# Patient Record
Sex: Female | Born: 1985 | Race: Black or African American | Hispanic: No | Marital: Single | Smoking: Current some day smoker
Health system: Southern US, Community
[De-identification: ages and names within clinical notes are randomized; demographics above are authoritative.]

## PROBLEM LIST (undated history)

## (undated) DIAGNOSIS — L0390 Cellulitis, unspecified: Secondary | ICD-10-CM

## (undated) DIAGNOSIS — A4902 Methicillin resistant Staphylococcus aureus infection, unspecified site: Secondary | ICD-10-CM

## (undated) HISTORY — PX: OVARIAN CYST REMOVAL: SHX89

## (undated) HISTORY — PX: BREAST SURGERY: SHX581

---

## 2008-05-17 ENCOUNTER — Encounter (INDEPENDENT_AMBULATORY_CARE_PROVIDER_SITE_OTHER): Payer: Self-pay | Admitting: Obstetrics and Gynecology

## 2008-05-17 ENCOUNTER — Ambulatory Visit (HOSPITAL_COMMUNITY): Admission: AD | Admit: 2008-05-17 | Discharge: 2008-05-17 | Payer: Self-pay | Admitting: Obstetrics and Gynecology

## 2010-07-06 LAB — COMPREHENSIVE METABOLIC PANEL
ALT: 28 U/L (ref 0–35)
Alkaline Phosphatase: 67 U/L (ref 39–117)
CO2: 23 mEq/L (ref 19–32)
Glucose, Bld: 100 mg/dL — ABNORMAL HIGH (ref 70–99)
Potassium: 3.1 mEq/L — ABNORMAL LOW (ref 3.5–5.1)
Sodium: 134 mEq/L — ABNORMAL LOW (ref 135–145)
Total Protein: 6.9 g/dL (ref 6.0–8.3)

## 2010-07-06 LAB — CBC
Hemoglobin: 12.1 g/dL (ref 12.0–15.0)
RBC: 4.51 MIL/uL (ref 3.87–5.11)
RDW: 13.9 % (ref 11.5–15.5)
WBC: 9 10*3/uL (ref 4.0–10.5)

## 2010-07-06 LAB — TYPE AND SCREEN: Antibody Screen: NEGATIVE

## 2010-07-06 LAB — PROTIME-INR: Prothrombin Time: 13.1 seconds (ref 11.6–15.2)

## 2010-08-03 NOTE — Op Note (Signed)
[name], [name] NO.:  [id number]   MEDICAL RECORD NO.:  [id number]          PATIENT TYPE:  AMB   LOCATION:  SDC                           FACILITY:  [facility]   PHYSICIAN:  Miguel Aschoff, M.D.       DATE OF BIRTH:  04/05/1985   DATE OF PROCEDURE:  05/17/2008  DATE OF DISCHARGE:                               OPERATIVE REPORT   PREOPERATIVE DIAGNOSIS:  Acute abdominal pain following suction  dilatation and evacuation, rule out perforation.   POSTOPERATIVE DIAGNOSIS:  Hematometra, no evidence of intra-abdominal  injury or uterine perforation.   SURGEON:  Miguel Aschoff, MD   ANESTHESIA:  General.   COMPLICATIONS:  None.   PROCEDURE:  Diagnostic laparoscopy followed by suction and curettage.   ANESTHESIA:  General.   JUSTIFICATION:  The patient is a 25 year old black female gravida 1,  para 0, who underwent elective termination of pregnancy at the [facility] on the morning of May 17, 2008.  Procedure was  carried out uneventfully.  However, in the recovery room, the patient  developed severe acute lower abdominal midline pain which seemed  weighing out of the norm for the expected postop course for suction D  and C.  Because of the nature of her severe lower abdominal pain, there  was concern that a perforation may have occurred.  The patient was  transferred to [facility] to undergo laparoscopy and laparotomy if  indicated.  The risks and benefits of surgical procedure were discussed  with the patient and her mother and informed consent was obtained for  laparoscopy, possible laparotomy, and suction D and C.   PROCEDURE:  The patient was taken to the operating and was placed in  supine position.  General anesthesia was administered without  difficulty.  She was then placed in dorsal lithotomy position, prepped  and draped in the usual sterile fashion.  The bladder was catheterized.  Following this, a Hulka tenaculum was placed through  the cervix.  After  examination revealed cervix to be anterior, globular, approximately  these equivalent size.  No definite adnexal masses were noted.  Following this, attention was directed to the abdomen where a small  infraumbilical incision was made.  A Veress needle was inserted and then  the abdomen was insufflated with 3 L of CO2.  Following insufflation,  the trocar to laparoscope was placed, followed by laparoscope itself.  Then, a 5-mm suprapubic port was established under direct visualization.  Systematic inspection revealed the uterus again to be globular anterior.  There was no evidence of any uterine perforation or hematoma present in  the pelvis, broad ligament or any abdominal structure.  The uterus  appeared to be intact.  Tubes and ovaries were inspected.  The tubes  were normal along their course.  The fimbria was fine and delicate.  The  left ovary was noted to be totally unremarkable.  The cul-de-sac was  unremarkable and then remarkable finding was a corpus luteum cyst  involving the right ovary which was approximately 4-5 cm in size.  The  remainder of the abdominal contents appeared within  normal limits.  The  intestinal surfaces were unremarkable.  The liver was inspected,  appeared to be within normal limits as was the gallbladder, but no  evidence of any perforation, hematoma, or any other traumatic injury  within the abdomen and pelvis.  It was elected to proceed with a repeat  curettage, it was felt the patient probably had a hematometra causing  her symptoms, and at this point, the suction curettage was carried out  without difficulty and did yield a large amount of clots from within the  confines of the uterus.  Following this, suction procedure, attention  was redirected to the abdomen to ensure that this also did not cause any  intra-abdominal complications, and again the uterus appeared to be  intact and within normal limits.  At this point, the CO2 was  allowed to  escape.  The small incisions were closed using subcuticular 4-0 Vicryl.  The port sites were injected with 1% Xylocaine.  The patient was  reversed from the anesthetic and taken to recovery room in satisfactory  condition.   Plan is for the patient to be discharged home if feeling well enough.   MEDICATIONS FOR HOME:  1. Doxycycline 100 mg b.i.d. x3 days.  2. Darvocet-N 100 one every 4 hours as needed for pain.   DISCHARGE INSTRUCTIONS:  She was instructed to place nothing in the  vagina and call for any problems such as fever, pain, or heavy bleeding.  She also instructed to get birth control pills on May 22, 2008, and set  up a followup visit in 4 weeks for followup examination.      Miguel Aschoff, M.D.  Electronically Signed     AR/MEDQ  D:  05/17/2008  T:  05/17/2008  Job:  [id number]

## 2011-03-15 ENCOUNTER — Encounter: Payer: Self-pay | Admitting: *Deleted

## 2011-03-15 ENCOUNTER — Emergency Department (HOSPITAL_BASED_OUTPATIENT_CLINIC_OR_DEPARTMENT_OTHER)
Admission: EM | Admit: 2011-03-15 | Discharge: 2011-03-15 | Disposition: A | Discharge status: Home / Self Care | Payer: Commercial Managed Care - PPO | Attending: Emergency Medicine | Admitting: Emergency Medicine

## 2011-03-15 DIAGNOSIS — H6690 Otitis media, unspecified, unspecified ear: Secondary | ICD-10-CM

## 2011-03-15 DIAGNOSIS — H9209 Otalgia, unspecified ear: Secondary | ICD-10-CM | POA: Insufficient documentation

## 2011-03-15 DIAGNOSIS — F17200 Nicotine dependence, unspecified, uncomplicated: Secondary | ICD-10-CM | POA: Insufficient documentation

## 2011-03-15 HISTORY — DX: Cellulitis, unspecified: L03.90

## 2011-03-15 HISTORY — DX: Methicillin resistant Staphylococcus aureus infection, unspecified site: A49.02

## 2011-03-15 MED ORDER — AMOXICILLIN-POT CLAVULANATE 875-125 MG PO TABS
1.0000 | ORAL_TABLET | Freq: Once | ORAL | Status: AC
  Administered 2011-03-15: 1 via ORAL
  Filled 2011-03-15: qty 1

## 2011-03-15 MED ORDER — OXYMETAZOLINE HCL 0.05 % NA SOLN
2.0000 | Freq: Once | NASAL | Status: AC
  Administered 2011-03-15: 2 via NASAL
  Filled 2011-03-15: qty 15

## 2011-03-15 MED ORDER — AMOXICILLIN-POT CLAVULANATE 875-125 MG PO TABS: 1.0000 | ORAL_TABLET | Freq: Two times a day (BID) | ORAL | Status: AC

## 2011-03-15 NOTE — ED Notes (Signed)
Right ear pain x 3 days

## 2011-03-16 NOTE — ED Provider Notes (Signed)
History     CSN: [id number]  Arrival date & time 03/15/11  1634   First MD Initiated Contact with Patient 03/15/11 1702      Chief Complaint  Patient presents with  . Otalgia    (Consider location/radiation/quality/duration/timing/severity/associated sxs/prior treatment) HPI Patient is a 25 yo F with 7/10 right ear pain worse with laying flat.  She has no nausea, vomiting, or fevers.  She has been diagnosed with intermittent ear infections recently that have been treated with amoxicillin.  Patient's pain has been relieved at home with alleve.  There are no other associated or modifying factors.   Past Medical History  Diagnosis Date  . MRSA (methicillin resistant Staphylococcus aureus)   . Cellulitis     Past Surgical History  Procedure Date  . Ovarian cyst removal     No family history on file.  History  Substance Use Topics  . Smoking status: Current Some Day Smoker    Types: Cigarettes  . Smokeless tobacco: Never Used  . Alcohol Use: No    OB History    Grav Para Term Preterm Abortions TAB SAB Ect Mult Living                  Review of Systems  Constitutional: Negative.   HENT: Positive for ear pain and ear discharge.   Eyes: Negative.   Respiratory: Negative.   Cardiovascular: Negative.   Gastrointestinal: Negative.   Genitourinary: Negative.   Musculoskeletal: Negative.   Skin: Negative.   Neurological: Negative.   Hematological: Negative.   Psychiatric/Behavioral: Negative.   All other systems reviewed and are negative.    Allergies  Review of patient's allergies indicates no known allergies.  Home Medications   Current Outpatient Rx  Name Route Sig Dispense Refill  . AMOXICILLIN-POT CLAVULANATE 875-125 MG PO TABS Oral Take 1 tablet by mouth 2 (two) times daily. 20 tablet 0    BP 137/63  Pulse 67  Temp(Src) 98.9 F (37.2 C) (Oral)  Resp 20  Ht 5\' 7"  (1.702 m)  Wt 273 lb (123.832 kg)  BMI 42.76 kg/m2  SpO2 100%  LMP  12/20/2010  Physical Exam  Nursing note and vitals reviewed. Constitutional: She is oriented to person, place, and time. She appears well-developed and well-nourished. No distress.  HENT:  Head: Normocephalic and atraumatic.  Right Ear: A middle ear effusion is present.  Left Ear: Tympanic membrane normal.  Nose: Nose normal.  Mouth/Throat: Uvula is midline and oropharynx is clear and moist.  Eyes: Conjunctivae and EOM are normal. Pupils are equal, round, and reactive to light.  Neck: Normal range of motion.  Cardiovascular: Normal rate, regular rhythm, normal heart sounds and intact distal pulses.  Exam reveals no gallop and no friction rub.   No murmur heard. Pulmonary/Chest: Effort normal and breath sounds normal. No respiratory distress. She has no wheezes. She has no rales.  Abdominal: Soft. Bowel sounds are normal. She exhibits no distension. There is no tenderness. There is no rebound and no guarding.  Musculoskeletal: Normal range of motion.  Neurological: She is alert and oriented to person, place, and time. No cranial nerve deficit. She exhibits normal muscle tone. Coordination normal.  Skin: Skin is warm and dry. No rash noted.  Psychiatric: She has a normal mood and affect.    ED Course  Procedures (including critical care time)  Labs Reviewed - No data to display No results found.   1. Otitis media       MDM  Patient has suffered with rent bouts of OM and has this again today.  Treated today with augmentin and discharged with 10 day prescription for this.  Patient was also given nasal spray and reported improvement with this. Patient was discharged in good condition.        Cyndra Numbers, MD 03/16/11 1002

## 2011-06-13 ENCOUNTER — Other Ambulatory Visit: Payer: Self-pay

## 2011-06-23 ENCOUNTER — Other Ambulatory Visit (HOSPITAL_COMMUNITY): Payer: Self-pay | Admitting: Maternal and Fetal Medicine

## 2011-06-23 ENCOUNTER — Encounter (HOSPITAL_COMMUNITY): Payer: Self-pay

## 2011-06-23 ENCOUNTER — Ambulatory Visit (HOSPITAL_COMMUNITY)
Admission: RE | Admit: 2011-06-23 | Discharge: 2011-06-23 | Disposition: A | Discharge status: Home / Self Care | Payer: Commercial Managed Care - PPO | Source: Ambulatory Visit | Attending: Obstetrics and Gynecology | Admitting: Obstetrics and Gynecology

## 2011-06-23 DIAGNOSIS — Z363 Encounter for antenatal screening for malformations: Secondary | ICD-10-CM | POA: Insufficient documentation

## 2011-06-23 DIAGNOSIS — Z1389 Encounter for screening for other disorder: Secondary | ICD-10-CM | POA: Insufficient documentation

## 2011-06-23 DIAGNOSIS — O283 Abnormal ultrasonic finding on antenatal screening of mother: Secondary | ICD-10-CM

## 2011-06-23 DIAGNOSIS — O368390 Maternal care for abnormalities of the fetal heart rate or rhythm, unspecified trimester, not applicable or unspecified: Secondary | ICD-10-CM

## 2011-06-23 DIAGNOSIS — O99210 Obesity complicating pregnancy, unspecified trimester: Secondary | ICD-10-CM | POA: Insufficient documentation

## 2011-06-23 DIAGNOSIS — O0930 Supervision of pregnancy with insufficient antenatal care, unspecified trimester: Secondary | ICD-10-CM | POA: Insufficient documentation

## 2011-06-23 DIAGNOSIS — O358XX0 Maternal care for other (suspected) fetal abnormality and damage, not applicable or unspecified: Secondary | ICD-10-CM | POA: Insufficient documentation

## 2011-06-23 DIAGNOSIS — E669 Obesity, unspecified: Secondary | ICD-10-CM | POA: Insufficient documentation

## 2011-06-27 NOTE — Progress Notes (Addendum)
Genetic Counseling  High-Risk Gestation Note  Appointment Date:  06/23/11 Referred By: Laurann Montana., MD Date of Birth:  05-23-1985    Pregnancy History: G2P0010 Estimated Date of Delivery: 11/15/11 Estimated Gestational Age: [redacted]w[redacted]d Attending: Particia Nearing, MD   Ms. [name] was seen for genetic counseling because of previous ultrasound finding of hydrops. The patient was accompanied by her mother today.   Ultrasound performed at the time of today's visit confirmed the pregnancy to be [redacted]w[redacted]d gestation. 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 Heart anatomy appeared normal with mild biventricular hypertrophy. MCA dopplers were elevated which indicates a higher likelihood of moderate to severe fetal anemia. Complete ultrasound results reported separately.   Hydrops fetalis occurs in approximately 1 in 3,000 newborns and refers to the accumulation of fluid within body cavities (abdominal cavity, pleural cavity surrounding the lungs, etc.) and/or within soft tissues of a fetus.  There are many causes of hydrops, both immune and nonimmune.  Hydrops is classified as immune if there appears to be an incompatibility between maternal blood and fetal blood, causing the mother's immune system to attack the fetal red blood cells.  [name] recently had a negative antibody screen through her OB office. Nonimmune hydrops occurs much more commonly and results when diseases or complications interfere with the baby's ability to manage fluid.  Some of the diseases and complications that can be associated with hydrops include heart or lung defects, severe anemias, prenatal or congenital infections, liver disease, placental disorders, chromosome abnormalities and birth defects.  However, it is still uncertain the mechanism through which hydrops may result.  Hydrops may appear as an isolated finding, or it may be seen along with other  ultrasound differences.  Often it is not possible to find a reason for hydrops in a pregnancy. [name] reported no known exposure to infections and denied known viral infections during the pregnancy.   Once hydrops has been identified in a pregnancy, there are several testing and management options available. These options include amniocentesis, maternal TORCH titers, a fetal echocardiogram, fetal intervention may be attempted if a cause is identified, careful monitoring, or ending the pregnancy. We reviewed chromosomes, genes, and examples of chromosome conditions. We specifically discussed the diagnostic testing option of amniocentesis for fetal karyotype analysis. We discussed that infection studies can also be performed on amniotic fluid. In some cases, amniocentesis may be able to diagnose single gene conditions. However, many single gene conditions cannot be identified prenatally.  We discussed another type of screening test, noninvasive prenatal testing (NIPT), which utilizes cell free fetal DNA found in the maternal circulation. This test is not diagnostic for chromosome conditions, but can provide information regarding the presence or absence of extra fetal DNA for chromosomes 13, 18 and 21. Thus, it would not identify or rule out all fetal aneuploidy. The reported detection rate is greater than 99% for Trisomy 21, greater than 97% for Trisomy 18, and is approximately 80% (8 out of 10) for Trisomy 13. The false positive rate is reported to be less than 1% for any of these conditions. If a cause is not identified for fetal hydrops, monitoring at frequent intervals is recommended.  Prognosis and treatment of hydrops varies significantly and depends on the cause and severity.  During pregnancy, hydrops may be treatable only in certain situations.  If the pregnancy survives to term, management of hydrops in a newborn may include removing excess fluid from spaces around  the lungs and abdomen,  medications, and providing help for respiratory distress through supplemental oxygen or a mechanical breathing machine.  Mortality rates have been reported to range from 75 to 90% or higher, depending on the severity and cause. Recurrence risk for a future pregnancy depends upon the underlying cause of hydrops. When an underlying cause is not determined for hydrops, empiric recurrence risk is approximately 5%.   After careful consideration, [name] was interested in an amniocentesis for karyotype and viral studies. Given that the patient may be a candidate for in utero transfusion, it was discussed that obtaining a sample for fetal karyotype and viral studies could be performed at the same time as the PUBS. Thus, following discussion between Dr. Sherrie George and Dr. Rachel Bo a plan was made for [name] to be seen today at [facility] in the prenatal assessment center by Dr. Rachel Bo. Complete ultrasound report from that visit reported separately.   [name] reported that she has sickle cell trait. We currently do not have medical records confirming this report at this time. The patient reported that her maternal half-brother and her mother also have sickle cell trait. The patient reported that the sickle cell carrier status of the father of the pregnancy is not known. He is reportedly healthy.  We discussed sickle cell anemia (SCA) including: the features of SCA, autosomal recessive inheritance, the approximate 1 in 73 carrier frequency in the [place] American population, and the availability of carrier testing via hemoglobin electrophoresis for the father of the pregnancy.  We also discussed that testing for SCA can be done prenatally via amniocentesis, in the case that both parents are identified to carry hemoglobin variants. We discussed that it is also routinely screened for on the newborn screening panel in [place].  We reviewed that although the ultrasound may appear normal, the  risk of anomalies cannot be completely eliminated, and that a baby with SCA would not appear different on a prenatal ultrasound. Prior to carrier screening for the father of the pregnancy, the risk for sickle cell anemia in the current pregnancy is approximately 1 in 43, assuming the general population carrier risk for the father of the pregnancy given no reported family history of sickle cell trait for him and no consanguinity to the patient. We are happy to facilitate carrier testing for the father of the pregnancy, if desired. He did not accompany the patient to her visit today.   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 The patient reported that her extra fingers were removed after birth. She is reportedly healthy. Polydactyly is typically an isolated trait that can occur sporadically in a person or inherited as a familial trait.  When inherited as an isolated trait, autosomal dominant inheritance is observed, meaning each pregnancy of a parent with polydactyly has a 50% (1 in 2) chance to inherit this genetic predisposition for polydactyly.  Not all individuals that inherit this predisposition would be born with polydactyly, but they would still be at increased risk to have affected children.  Less commonly, polydactyly may be one feature of an underlying genetic condition that may or may not be inherited. Given the reported family history, we discussed that the polydactyly in the family is most likely isolated and not a feature of an underlying genetic or chromosome condition, in which case recurrence risk for her children is 1 in 2 (50%). However, if it were due to  a different underlying cause, recurrence risk estimate may change. Targeted ultrasound is available to assess for polydactyly in the pregnancy. The patient understands that ultrasound cannot rule out all birth defects prenatally.  Additionally, the patient  reported a female maternal first cousin with hydrocephalus who died at less than 40 years of age. He was otherwise healthy. An underlying cause is not known for his hydrocephalus and limited information is known given that this occurred greater than 25 years ago. Hydrocephalus may be an isolated finding that occurs sporadically, an isolated condition that is inherited or passed through families, or it may be one feature of an underlying genetic syndrome or chromosome abnormality.  If it were a sporadic isolated event, the paternal first cousin once removed is not expected to increase the risk to the current pregnancy over the general population risk. However, some forms of hydrocephalus are seen to follow an X-linked recessive form, where females can be carriers and affected males inherit the X chromosome with the change on it.  Also, in the case that the hydrocephalus were one feature of an underlying genetic condition or syndrome, recurrence risk would depend upon the inheritance of the underlying condition. Targeted ultrasound is available to assess for hydrocephalus. However, ultrasound cannot diagnose or rule out all birth defects prenatally. Without further information regarding the provided family history, an accurate genetic risk cannot be calculated. Further genetic counseling is warranted if more information is obtained.  Ms. Maniaci denied exposure to environmental toxins or chemical agents. She denied the use of alcohol, tobacco or street drugs. She denied significant viral illnesses during the course of her pregnancy. Her medical and surgical histories were noncontributory.   I counseled Ms. Linzy Laury for approximately 40 minutes regarding the above risks and available options.     Quinn Plowman, MS,  Certified Genetic Counselor 06/27/2011

## 2011-07-12 ENCOUNTER — Ambulatory Visit (HOSPITAL_COMMUNITY): Payer: Commercial Managed Care - PPO

## 2012-11-05 IMAGING — US US OB DETAIL+14 WK
1 series · 16 of 28 positions shown · non-contrast
Comparison: none

[Series 1: us ob detail+14 wk · 16 of 104 slices shown]
[im 1/104]
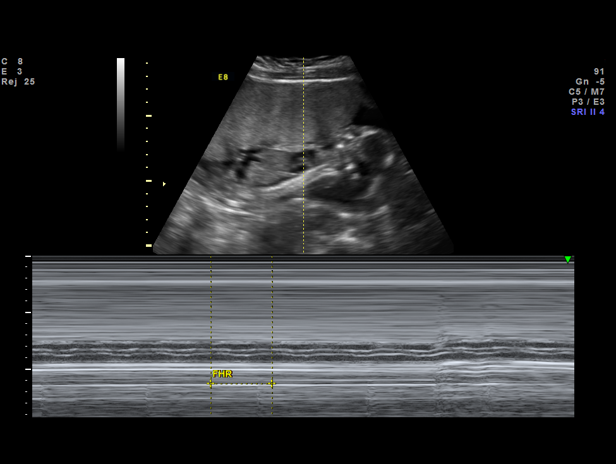
[im 8/104]
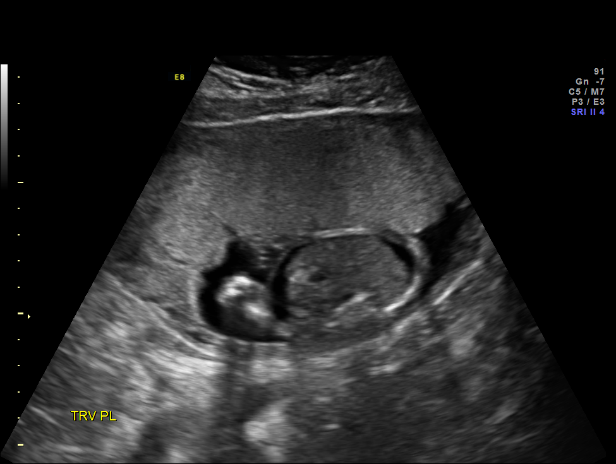
[im 16/104]
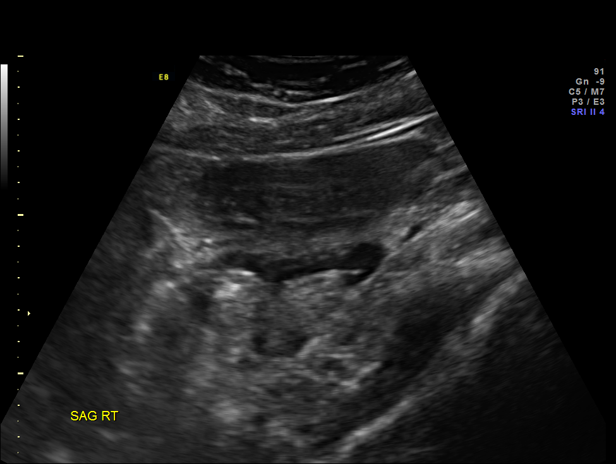
[im 23/104]
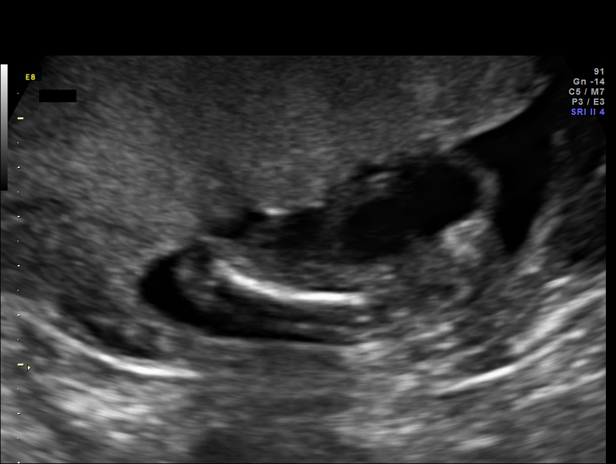
[im 27/104]
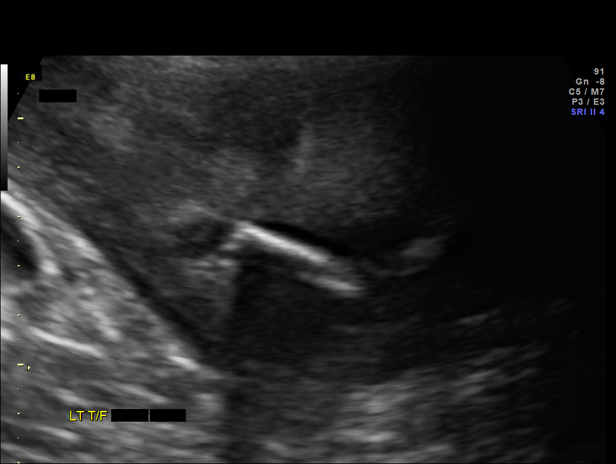
[im 35/104]
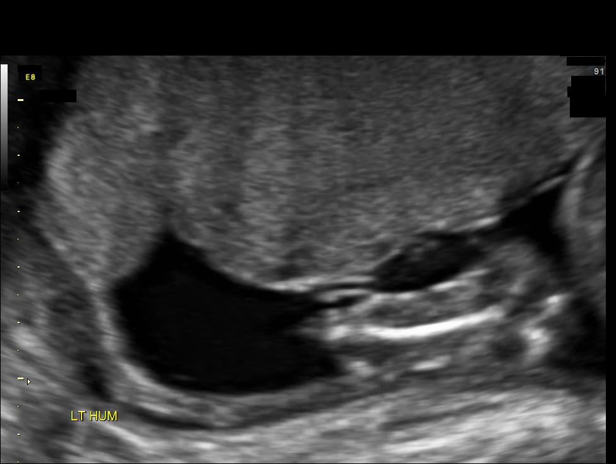
[im 42/104]
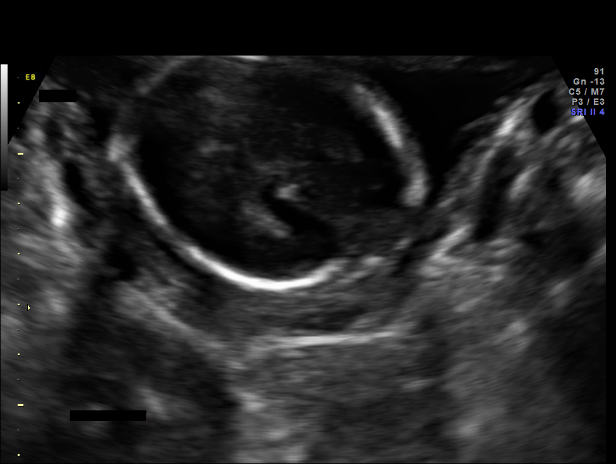
[im 50/104]
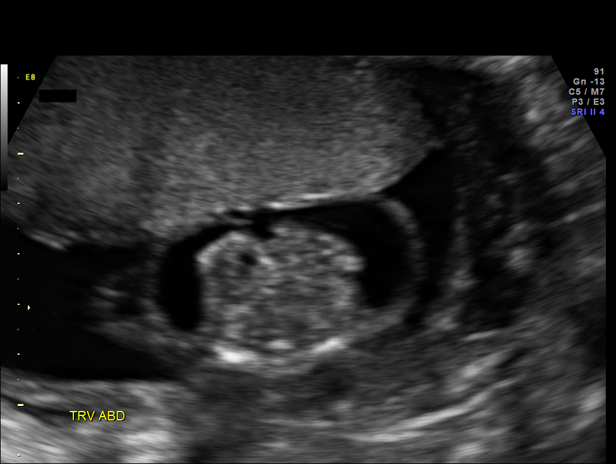
[im 54/104]
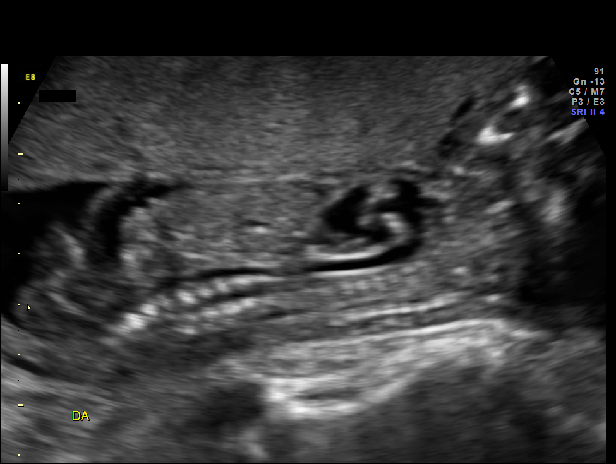
[im 62/104]
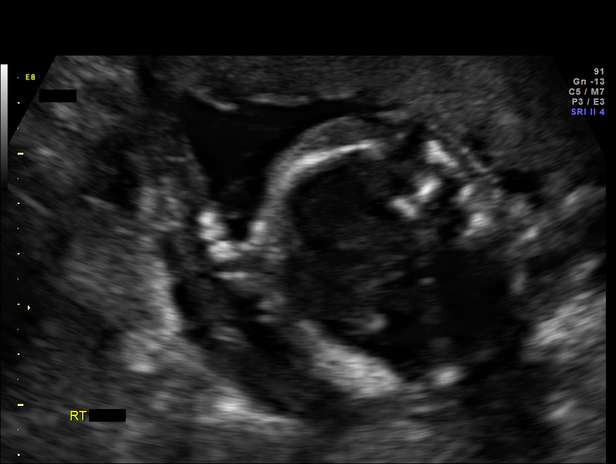
[im 69/104]
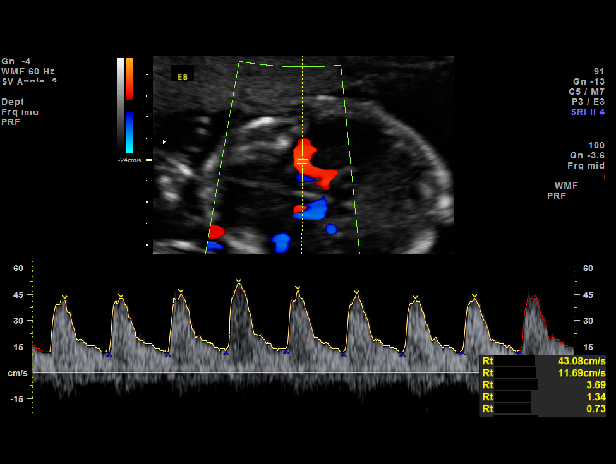
[im 77/104]
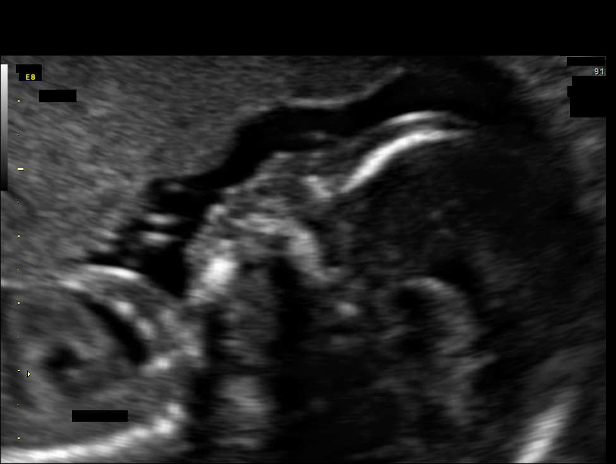
[im 81/104]
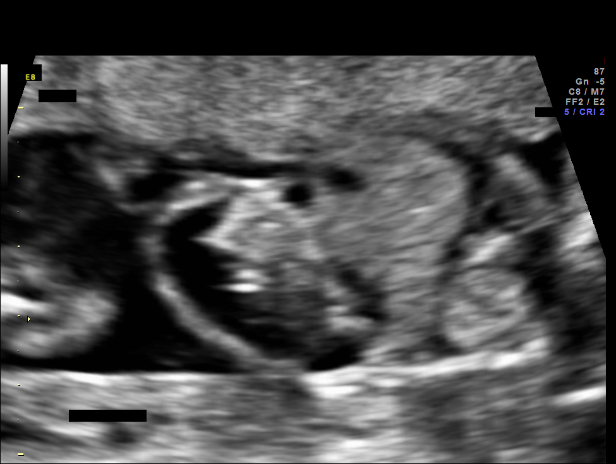
[im 88/104]
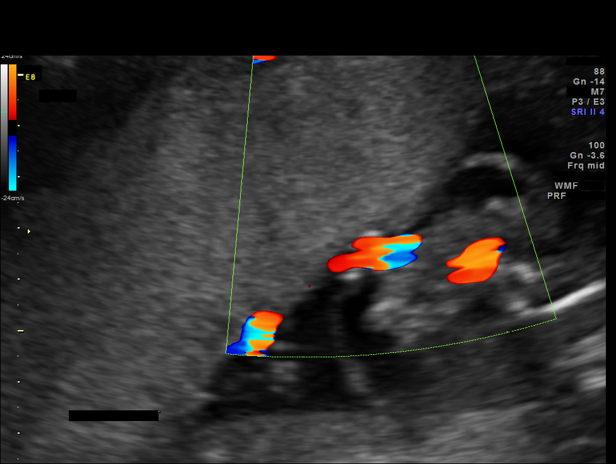
[im 96/104]
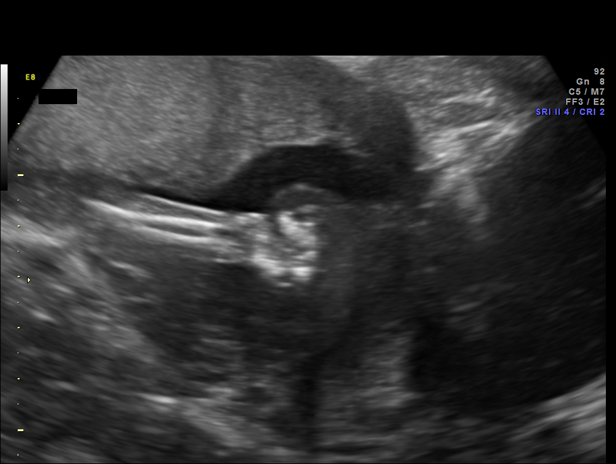
[im 104/104]
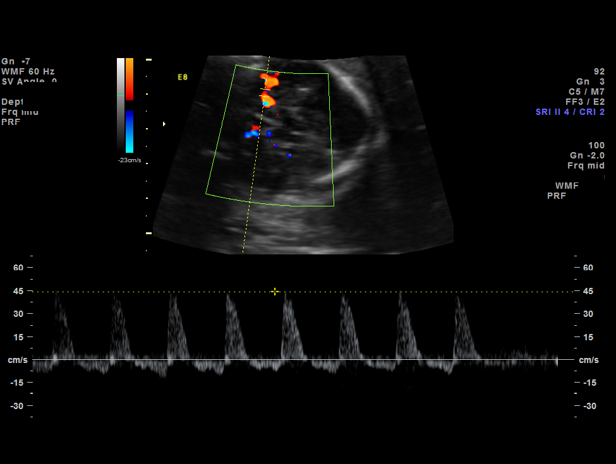

[16 of 28 positions shown; findings below may reference images not displayed]

OBSTETRICS REPORT
                      (Signed Final 06/23/2011 [DATE])

 Order#:         [id number]
                 50_O
Procedures

 US OB DETAIL + 14 WK                                  76811.0
 US MCA DOPPLER                                        76821.0
Indications

 Detailed fetal anatomic survey
 Fetal abnormality - other known or suspected
 (specify)
 No or Little Prenatal Care
 Obesity
Fetal Evaluation

 Fetal Heart Rate:  139                          bpm
 Cardiac Activity:  Observed
 Presentation:      Cephalic
 Placenta:          Anterior, above cervical os
 P. Cord            Visualized
 Insertion:

 Amniotic Fluid
 AFI FV:      Subjectively low normal
                                             Larg Pckt:     2.8  cm
Biometry

 BPD:     45.3  mm     G. Age:  19w 5d                CI:         72.5   70 - 86
 OFD:     62.5  mm                                    FL/HC:      18.3   16.1 -

 HC:     174.4  mm     G. Age:  20w 0d       74  %    HC/AC:      1.07   1.09 -

 AC:     162.3  mm     G. Age:  21w 2d       94  %    FL/BPD:
 FL:      31.9  mm     G. Age:  20w 0d       66  %    FL/AC:      19.7   20 - 24
 HUM:     27.1  mm     G. Age:  18w 4d       33  %

 Est. FW:     363  gm    0 lb 13 oz      64  %
Gestational Age

 U/S Today:     20w 2d                                        EDD:   11/08/11
 Best:          19w 2d     Det. By:  Previous Ultrasound      EDD:   11/15/11
Anatomy

 Cranium:           Appears normal      Aortic Arch:       Appears normal
 Fetal Cavum:       Appears normal      Ductal Arch:       Appears normal
 Ventricles:        Appears normal      Diaphragm:         Appears normal
 Choroid Plexus:    Appears normal      Stomach:           Appears
                                                           normal, left
                                                           sided
 Cerebellum:        Not well            Abdomen:           Ascites
                    visualized
 Posterior Fossa:   Not well            Abdominal Wall:    Appears nml
                    visualized                             (cord insert,
                                                           abd wall)
 Nuchal Fold:       Not well            Cord Vessels:      Appears normal
                    visualized                             (3 vessel cord)
 Face:              Appears normal      Kidneys:           Appear normal
                    (lips/profile/orbit
                    s)
 Heart:             Pericardial         Bladder:           Small
                    effusion
 RVOT:              Appears normal      Spine:             Not well
                                                           visualized
 LVOT:              Appears normal      Limbs:             Appears normal
                                                           (hands, ankles,
                                                           feet)

 Other:     Heels appears normal. Fetus appears to be a female.
Doppler - Fetal Vessels

 Middle Cerebral Artery
 S/D:   4.17                           RI:
 PI:    1.47                           PSV:       43.08   cm/s      1.76  MoM

Cervix Uterus Adnexa

 Cervical Length:    3.3      cm

 Cervix:       Normal appearance by transabdominal scan. Appears
               closed, without funnelling.
 Left Ovary:    Within normal limits.
 Right Ovary:   Within normal limits.
Impression

 IUP at 19+2 weeks
 Non-immune hydrops with a moderate amount of ascites and
 pericardial effusion; heart anatomy appeared normal with
 mild biventricular hypertrophy
 All other detailed fetal anatomy was seen and appeared
 normal; posterior fossa and spine were not optimally
 visualized
 Low normal amniotic fluid volume
 Measurements consistent with prior U/S
 MCA dopplers were elevated which indicates a higher
 likelihood of moderate to severe fetal anemia

 The US findings were shared with Ms. Lobo and her
 mother. The implications of hydrops fetalis were discussed in
 detail. She understands that the prognosis is very guarded at
 this time. The MCAs were difficult to obtain but if they are
 correct, the fetus may be a candidate for a in utero
 transfusion. She had a recent antibody screen which was
 negative and she doesn't recall being near any sick children.
 Ms. Lobo was interested in an amniocentesis for
 karyotype and viral studies but this could be performed at the
 same time as the PUBS. I discussed the patient with Dr.
 Nestor Francisco Timana who agreed to see Ms. Lobo today at
 [facility].
Recommendations

 To be determined after visit to [facility].

## 2013-12-27 ENCOUNTER — Other Ambulatory Visit (HOSPITAL_COMMUNITY): Payer: Self-pay | Admitting: Specialist

## 2013-12-27 DIAGNOSIS — Z3A35 35 weeks gestation of pregnancy: Secondary | ICD-10-CM

## 2013-12-27 DIAGNOSIS — O365933 Maternal care for other known or suspected poor fetal growth, third trimester, fetus 3: Secondary | ICD-10-CM

## 2014-01-01 ENCOUNTER — Ambulatory Visit (HOSPITAL_COMMUNITY)
Admission: RE | Admit: 2014-01-01 | Discharge: 2014-01-01 | Disposition: A | Discharge status: Home / Self Care | Payer: Medicaid Other | Source: Ambulatory Visit | Attending: Specialist | Admitting: Specialist

## 2014-01-01 ENCOUNTER — Encounter (HOSPITAL_COMMUNITY): Payer: Self-pay

## 2014-01-01 DIAGNOSIS — O365930 Maternal care for other known or suspected poor fetal growth, third trimester, not applicable or unspecified: Secondary | ICD-10-CM | POA: Diagnosis not present

## 2014-01-01 DIAGNOSIS — Z3A35 35 weeks gestation of pregnancy: Secondary | ICD-10-CM | POA: Diagnosis not present

## 2014-01-01 DIAGNOSIS — O365933 Maternal care for other known or suspected poor fetal growth, third trimester, fetus 3: Secondary | ICD-10-CM

## 2014-01-20 ENCOUNTER — Encounter (HOSPITAL_COMMUNITY): Payer: Self-pay

## 2014-11-06 ENCOUNTER — Encounter (HOSPITAL_COMMUNITY): Payer: Self-pay | Admitting: *Deleted

## 2015-11-16 ENCOUNTER — Emergency Department (HOSPITAL_BASED_OUTPATIENT_CLINIC_OR_DEPARTMENT_OTHER)
Admission: EM | Admit: 2015-11-16 | Discharge: 2015-11-16 | Disposition: A | Discharge status: Home / Self Care | Payer: Commercial Managed Care - PPO | Attending: Emergency Medicine | Admitting: Emergency Medicine

## 2015-11-16 ENCOUNTER — Encounter (HOSPITAL_BASED_OUTPATIENT_CLINIC_OR_DEPARTMENT_OTHER): Payer: Self-pay | Admitting: Emergency Medicine

## 2015-11-16 DIAGNOSIS — F17210 Nicotine dependence, cigarettes, uncomplicated: Secondary | ICD-10-CM | POA: Insufficient documentation

## 2015-11-16 DIAGNOSIS — R0981 Nasal congestion: Secondary | ICD-10-CM | POA: Insufficient documentation

## 2015-11-16 DIAGNOSIS — K5900 Constipation, unspecified: Secondary | ICD-10-CM | POA: Insufficient documentation

## 2015-11-16 DIAGNOSIS — R509 Fever, unspecified: Secondary | ICD-10-CM

## 2015-11-16 DIAGNOSIS — J029 Acute pharyngitis, unspecified: Secondary | ICD-10-CM | POA: Insufficient documentation

## 2015-11-16 DIAGNOSIS — R059 Cough, unspecified: Secondary | ICD-10-CM

## 2015-11-16 DIAGNOSIS — R05 Cough: Secondary | ICD-10-CM

## 2015-11-16 LAB — RAPID STREP SCREEN (MED CTR MEBANE ONLY): STREPTOCOCCUS, GROUP A SCREEN (DIRECT): NEGATIVE

## 2015-11-16 LAB — MONONUCLEOSIS SCREEN: MONO SCREEN: NEGATIVE

## 2015-11-16 MED ORDER — ACETAMINOPHEN 325 MG PO TABS
650.0000 mg | ORAL_TABLET | Freq: Once | ORAL | Status: AC
  Administered 2015-11-16: 650 mg via ORAL
  Filled 2015-11-16: qty 2

## 2015-11-16 NOTE — Discharge Instructions (Signed)
Follow-up with your primary care provider or the [facility] in 2-3 days if your symptoms are not improving. Take Tylenol or ibuprofen for fever and pain. Patient to drink plenty of fluids. Do not return to work until your fever free.  Return to the emergency department if you experience headache, neck pain, neck stiffness, nausea, vomiting, uncontrolled fever, chest pain, shortness of breath, visual changes or any other concerning symptoms.

## 2015-11-16 NOTE — ED Triage Notes (Signed)
Patient was dx 2 weeks ago with Strep throat. The patient reports that she started to have fevers, chills and aches today with sore throat

## 2015-11-16 NOTE — ED Provider Notes (Signed)
[facility] Provider Note   CSN: [id number] Arrival date & time: 11/16/15  1615  By signing my name below, I, Phillis HaggisGabriella Gaje, attest that this documentation has been prepared under the direction and in the presence of Mattie MarlinJessica Ashvin Adelson, PA-C. Electronically Signed: Phillis HaggisGabriella Gaje, ED Scribe. 11/16/15. 5:33 PM.  History   Chief Complaint Chief Complaint  Patient presents with  . Sore Throat   The history is provided by the patient. No language interpreter was used.  HPI Comments: [name] is a 30 y.o. female who presents to the Emergency Department complaining of sudden onset fever tmax 101.8 F onset 4 hours ago. Pt reports associated chills, burning sore throat, bilateral eye pain, nasal congestion, generalized myalgias, headache, mild right ear pain, constipation that began today, and a burning sensation in her upper chest. Pt was diagnosed with strep throat two weeks ago and finished her 10 day course of antibiotics with magic mouthwash. She states that the sore throat she currently has is different from her past strep throat. She has not taken anything for her symptoms today. Pt denies sick contacts, SOB, chest pain, diarrhea, nausea, or vomiting.   Past Medical History:  Diagnosis Date  . Cellulitis   . MRSA (methicillin resistant Staphylococcus aureus)     There are no active problems to display for this patient.   Past Surgical History:  Procedure Laterality Date  . OVARIAN CYST REMOVAL      OB History    Gravida Para Term Preterm AB Living   3 1 0 1 1 0   SAB TAB Ectopic Multiple Live Births   0 1 0 0         Home Medications    Prior to Admission medications   Medication Sig Start Date End Date Taking? Authorizing Provider  Ondansetron HCl (ZOFRAN PO) Take by mouth.    Historical Provider, MD  Pediatric Multi Vit-Extra C-FA (CHILDRENS MULTIVITAMINS PO) Take by mouth.    Historical Provider, MD    Family History History reviewed. No pertinent  family history.  Social History Social History  Substance Use Topics  . Smoking status: Current Some Day Smoker    Types: Cigarettes  . Smokeless tobacco: Never Used  . Alcohol use No     Allergies   Review of patient's allergies indicates no known allergies.   Review of Systems Review of Systems  Constitutional: Positive for chills and fever.  HENT: Positive for congestion and sore throat.   Eyes: Positive for pain.  Respiratory: Negative for shortness of breath.   Cardiovascular: Negative for chest pain.  Gastrointestinal: Positive for constipation. Negative for diarrhea, nausea and vomiting.  Musculoskeletal: Positive for myalgias.  Neurological: Positive for headaches.   Physical Exam Updated Vital Signs BP 147/87 (BP Location: Right Arm)   Temp 101.8 F (38.8 C) (Oral)   Resp 20   Ht 5\' 8"  (1.727 m)   Wt 247 lb (112 kg)   LMP 10/20/2015   SpO2 100%   BMI 37.56 kg/m   Physical Exam  Constitutional: She appears well-developed and well-nourished. No distress.  HENT:  Head: Normocephalic and atraumatic.  Mouth/Throat: Uvula is midline and mucous membranes are normal. No oral lesions. No trismus in the jaw. No uvula swelling. Posterior oropharyngeal edema and posterior oropharyngeal erythema present. No oropharyngeal exudate or tonsillar abscesses.  Mild erythema and edema to bilateral tonsils  Eyes: Conjunctivae and EOM are normal. Pupils are equal, round, and reactive to light.  Neck: Normal range of  motion.  Cardiovascular: Normal rate and regular rhythm.   Pulmonary/Chest: Effort normal and breath sounds normal. No respiratory distress.  Musculoskeletal: Normal range of motion.  Lymphadenopathy:       Head (right side): Tonsillar adenopathy present. No submental, no submandibular, no preauricular and no posterior auricular adenopathy present.       Head (left side): Tonsillar adenopathy present. No submental, no submandibular, no preauricular and no posterior  auricular adenopathy present.    She has no cervical adenopathy.  Neurological: She is alert. Coordination normal.  Skin: Skin is warm and dry. She is not diaphoretic.  Psychiatric: She has a normal mood and affect. Her behavior is normal.  Nursing note and vitals reviewed.   ED Treatments / Results  DIAGNOSTIC STUDIES: Oxygen Saturation is 100% on RA, nromal by my interpretation.    COORDINATION OF CARE: 5:32 PM-Discussed treatment plan which includes strep screen and flu screen with pt at bedside and pt agreed to plan.    Labs (all labs ordered are listed, but only abnormal results are displayed) Labs Reviewed  RAPID STREP SCREEN (NOT AT [facility])  CULTURE, GROUP A STREP [facility])  MONONUCLEOSIS SCREEN    EKG  EKG Interpretation None       Radiology No results found.  Procedures Procedures (including critical care time)  Medications Ordered in ED Medications  acetaminophen (TYLENOL) tablet 650 mg (650 mg Oral Given 11/16/15 1709)     Initial Impression / Assessment and Plan / ED Course  I have reviewed the triage vital signs and the nursing notes.  Pertinent labs & imaging results that were available during my care of the patient were reviewed by me and considered in my medical decision making (see chart for details).  Clinical Course   Pt with flu-like symptoms. Negative strep and mono. Pt not tachycardic, no abdominal pain, nausea, vomiting, trismus. Presentation and history less concerning for GI bug, Post pharyngeal abscess, tonsillar abscess, Ludwig's angina. Patient able to tolerate secretions, no pain with swallowing or difficulty with swallowing. This is likely a viral fever could be the flu. Discharged pt with symptomatic treatment. Discussed f/u in 2 days with PCP or [facility] if symptoms are not improving. Discussed strict return precautions. Pt expressed understanding to the discharge instructions.   I personally performed the  services described in this documentation, which was scribed in my presence. The recorded information has been reviewed and is accurate.   Pt case discussed with Dr. Fredderick Phenix who agrees with the above plan.  Final Clinical Impressions(s) / ED Diagnoses   Final diagnoses:  Fever, unspecified fever cause  Cough  Sore throat    New Prescriptions Discharge Medication List as of 11/16/2015  7:13 PM       Jerre Simon, PA 11/16/15 2138    Rolan Bucco, MD 11/16/15 [id number]

## 2015-11-19 LAB — CULTURE, GROUP A STREP (THRC)

## 2019-04-30 ENCOUNTER — Emergency Department (HOSPITAL_BASED_OUTPATIENT_CLINIC_OR_DEPARTMENT_OTHER)
Admission: EM | Admit: 2019-04-30 | Discharge: 2019-04-30 | Disposition: A | Discharge status: Home / Self Care | Payer: Medicaid Other | Attending: Emergency Medicine | Admitting: Emergency Medicine

## 2019-04-30 ENCOUNTER — Encounter (HOSPITAL_BASED_OUTPATIENT_CLINIC_OR_DEPARTMENT_OTHER): Payer: Self-pay | Admitting: *Deleted

## 2019-04-30 ENCOUNTER — Emergency Department (HOSPITAL_BASED_OUTPATIENT_CLINIC_OR_DEPARTMENT_OTHER): Payer: Medicaid Other

## 2019-04-30 ENCOUNTER — Other Ambulatory Visit: Payer: Self-pay

## 2019-04-30 DIAGNOSIS — Z79899 Other long term (current) drug therapy: Secondary | ICD-10-CM | POA: Insufficient documentation

## 2019-04-30 DIAGNOSIS — R1013 Epigastric pain: Secondary | ICD-10-CM | POA: Diagnosis not present

## 2019-04-30 DIAGNOSIS — I10 Essential (primary) hypertension: Secondary | ICD-10-CM | POA: Insufficient documentation

## 2019-04-30 DIAGNOSIS — Z720 Tobacco use: Secondary | ICD-10-CM | POA: Insufficient documentation

## 2019-04-30 DIAGNOSIS — R001 Bradycardia, unspecified: Secondary | ICD-10-CM | POA: Diagnosis not present

## 2019-04-30 DIAGNOSIS — R197 Diarrhea, unspecified: Secondary | ICD-10-CM | POA: Insufficient documentation

## 2019-04-30 DIAGNOSIS — R0602 Shortness of breath: Secondary | ICD-10-CM | POA: Insufficient documentation

## 2019-04-30 DIAGNOSIS — Z20822 Contact with and (suspected) exposure to covid-19: Secondary | ICD-10-CM | POA: Diagnosis not present

## 2019-04-30 LAB — COMPREHENSIVE METABOLIC PANEL
ALT: 24 U/L (ref 0–44)
AST: 17 U/L (ref 15–41)
Albumin: 3.5 g/dL (ref 3.5–5.0)
Alkaline Phosphatase: 74 U/L (ref 38–126)
Anion gap: 6 (ref 5–15)
BUN: 12 mg/dL (ref 6–20)
CO2: 25 mmol/L (ref 22–32)
Calcium: 8.7 mg/dL — ABNORMAL LOW (ref 8.9–10.3)
Chloride: 109 mmol/L (ref 98–111)
Creatinine, Ser: 0.71 mg/dL (ref 0.44–1.00)
GFR calc Af Amer: >60 mL/min (ref >60)
GFR calc non Af Amer: >60 mL/min (ref >60)
Glucose, Bld: 86 mg/dL (ref 70–99)
Potassium: 3.7 mmol/L (ref 3.5–5.1)
Sodium: 140 mmol/L (ref 135–145)
Total Bilirubin: 0.3 mg/dL (ref 0.3–1.2)
Total Protein: 7.1 g/dL (ref 6.5–8.1)

## 2019-04-30 LAB — CBC WITH DIFFERENTIAL/PLATELET
Abs Immature Granulocytes: 0.01 10*3/uL (ref 0.00–0.07)
Basophils Absolute: 0 10*3/uL (ref 0.0–0.1)
Basophils Relative: 0 %
Eosinophils Absolute: 0.3 10*3/uL (ref 0.0–0.5)
Eosinophils Relative: 5 %
HCT: 35.5 % — ABNORMAL LOW (ref 36.0–46.0)
Hemoglobin: 12.2 g/dL (ref 12.0–15.0)
Immature Granulocytes: 0 %
Lymphocytes Relative: 35 %
Lymphs Abs: 2.5 10*3/uL (ref 0.7–4.0)
MCH: 25.8 pg — ABNORMAL LOW (ref 26.0–34.0)
MCHC: 34.4 g/dL (ref 30.0–36.0)
MCV: 75.2 fL — ABNORMAL LOW (ref 80.0–100.0)
Monocytes Absolute: 0.7 10*3/uL (ref 0.1–1.0)
Monocytes Relative: 10 %
Neutro Abs: 3.5 10*3/uL (ref 1.7–7.7)
Neutrophils Relative %: 50 %
Platelets: 254 10*3/uL (ref 150–400)
RBC: 4.72 MIL/uL (ref 3.87–5.11)
RDW: 14.7 % (ref 11.5–15.5)
WBC: 7.1 10*3/uL (ref 4.0–10.5)
nRBC: 0 % (ref 0.0–0.2)

## 2019-04-30 LAB — TROPONIN I (HIGH SENSITIVITY): Troponin I (High Sensitivity): <2 ng/L (ref <18)

## 2019-04-30 NOTE — ED Provider Notes (Signed)
[facility] EMERGENCY DEPARTMENT Provider Note   CSN: [id number] Arrival date & time: 04/30/19  1947   History Chief Complaint  Patient presents with  . Shortness of Breath    [name] is a 34 y.o. female with a PMHx of HTN who presents to the ED with c/o SOB.   [name] states that she has been having nasal congestion for approximately 2 weeks.  In the last 4 days, she developed chest pain that was substernal and exacerbated by movement, such as rolling in bed. She denies exacerbation of pain with exertion or at rest.  Pain is described as stabbing or shooting, and lasts just a couple seconds at a time.  Denies radiation of pain to neck or arms. In the last 4 days, she has also developed diarrhea that is nonbloody, nonmelanotic, with approximately 2-3 episodes per day. She has had intermittent epigastric abdominal pain that is nonradiating.  In the last 2 days, she developed shortness of breath that is exacerbated by talking. She denies any fever, chills, shortness of breath at rest, sore throat, cough, nausea, vomiting.  [name] endorses dizziness with decreased p.o. intake for the last 1 week.  She notes that a coworker who is closest to her physically at work has recently tested positive. This was especially concerning for the patient, as they often share supplies at work, eat/smoke cigarettes without a mask.   She also notes that her kids were recently positive around Christmas time, but at that time her rapid Covid test was negative.  Past Medical History:  Diagnosis Date  . Cellulitis   . MRSA (methicillin resistant Staphylococcus aureus)   - Hypertension  Past Surgical History:  Procedure Laterality Date  . BREAST SURGERY    . OVARIAN CYST REMOVAL       OB History    Gravida  3   Para  1   Term  0   Preterm  1   AB  1   Living  0     SAB  0   TAB  1   Ectopic  0   Multiple  0   Live Births              No family  history on file.  Social History   Tobacco Use  . Smoking status: Current Some Day Smoker    Types: Cigarettes  . Smokeless tobacco: Never Used  Substance Use Topics  . Alcohol use: No  . Drug use: No    Home Medications Prior to Admission medications   Medication Sig Start Date End Date Taking? Authorizing Provider  hydrochlorothiazide (HYDRODIURIL) 25 MG tablet hydrochlorothiazide 25 mg tablet  Take 1 tablet every day by oral route. 07/09/16  Yes [provider]  Ondansetron HCl (ZOFRAN PO) Take by mouth.    [provider]  Pediatric Multi Vit-Extra C-FA (CHILDRENS MULTIVITAMINS PO) Take by mouth.    [provider]    Allergies    Patient has no known allergies.  Review of Systems   Review of Systems  Constitutional: Negative for chills and fever.  HENT: Positive for congestion, postnasal drip and rhinorrhea. Negative for sore throat.   Respiratory: Positive for shortness of breath. Negative for cough, chest tightness and wheezing.   Cardiovascular: Positive for chest pain. Negative for palpitations and leg swelling.  Gastrointestinal: Positive for abdominal pain and diarrhea. Negative for anal bleeding, blood in stool, nausea and vomiting.  Genitourinary: Negative for decreased urine volume,  difficulty urinating, dysuria and hematuria.  Musculoskeletal: Negative for arthralgias and myalgias.  Skin: Negative for rash and wound.  Neurological: Positive for dizziness. Negative for syncope and weakness.    Physical Exam Updated Vital Signs BP (!) 145/60 (BP Location: Right Arm)   Pulse (!) 52   Temp 98.9 F (37.2 C) (Oral)   Resp 20   Ht 5\' 7"  (1.702 m)   Wt 111.6 kg   SpO2 97%   BMI 38.53 kg/m   Physical Exam Vitals and nursing note reviewed.  Constitutional:      General: She is not in acute distress.    Appearance: She is obese.  Cardiovascular:     Rate and Rhythm: Normal rate and regular rhythm.     Heart sounds: No murmur.    Pulmonary:     Effort: Pulmonary effort is normal. No tachypnea, accessory muscle usage or respiratory distress.     Breath sounds: No decreased breath sounds, wheezing, rhonchi or rales.  Abdominal:     General: Bowel sounds are normal.     Palpations: Abdomen is soft.     Tenderness: There is no abdominal tenderness. There is no rebound.  Musculoskeletal:     Right lower leg: No tenderness. No edema.     Left lower leg: No tenderness. No edema.  Skin:    General: Skin is warm and dry.  Neurological:     General: No focal deficit present.     Mental Status: She is alert and oriented to person, place, and time.  Psychiatric:        Mood and Affect: Mood normal.        Behavior: Behavior normal.    ED Results / Procedures / Treatments   Labs (all labs ordered are listed, but only abnormal results are displayed) Labs Reviewed  COMPREHENSIVE METABOLIC PANEL - Abnormal; Notable for the following components:      Result Value   Calcium 8.7 (*)    All other components within normal limits  CBC WITH DIFFERENTIAL/PLATELET - Abnormal; Notable for the following components:   HCT 35.5 (*)    MCV 75.2 (*)    MCH 25.8 (*)    All other components within normal limits  NOVEL CORONAVIRUS, NAA (HOSP ORDER, SEND-OUT TO REF LAB; TAT 18-24 HRS)  TROPONIN I (HIGH SENSITIVITY)  TROPONIN I (HIGH SENSITIVITY)    EKG EKG Interpretation  Date/Time:  Tuesday April 30 2019 21:18:51 EST Ventricular Rate:  55 PR Interval:    QRS Duration: 101 QT Interval:  416 QTC Calculation: 398 R Axis:   46 Text Interpretation: Sinus rhythm Baseline wander in lead(s) V5 V6 No STEMI. Confirmed by 10[birth year]17-1989 [phone]) on 04/30/2019 9:25:33 PM   Radiology DG Chest Portable 1 View  Result Date: 04/30/2019 CLINICAL DATA:  Shortness of breath, chest pain EXAM: PORTABLE CHEST 1 VIEW COMPARISON:  07/09/2016 FINDINGS: Heart is borderline in size. Lungs are clear. No effusions or edema. No acute bony abnormality.  IMPRESSION: No active disease. Electronically Signed   By: 07/11/2016 M.D.   On: 04/30/2019 21:21    Procedures Procedures (including critical care time)  Medications Ordered in ED Medications - No data to display  ED Course  I have reviewed the triage vital signs and the nursing notes.  Pertinent labs & imaging results that were available during my care of the patient were reviewed by me and considered in my medical decision making (see chart for details).    MDM Rules/Calculators/A&P  [name] is a 34 year old female presenting to the ED with acute shortness of breath, chest pain, diarrhea and is concerned for COVID-19 infection after recent exposures at work.  She describes her chest pain as shooting/stabbing with substernal location without radiation.  The pain comes on randomly and the only lasts a couple seconds at a time.  It is not worsened with exertion and not coupled with shortness of breath or diaphoresis.  Given these characteristics, unlikely to be cardiac in etiology.  Troponin was checked just in case and negative.  EKG without ischemic changes.  Of note, she was noted to be bradycardic with lowest heart rate in the low 50s.  She is asymptomatic and I emphasized that she follow up with her PCP regarding this.  Patient does note chronic shortness of breath due to body habitus but states presence of it while talking is new.  No abnormalities on pulmonary examination and no acute findings on chest x-ray.  May be secondary to acute viral infection given the presence of diarrhea and rhinorrhea with nasal congestion.   All initial labs are reassuring without evidence of dehydration, leukocytosis, electrolyte abnormalities.  Will obtain a send out COVID-19 test with instructions to watch for results on MyChart.  Patient is hemodynamically stable.  Will plan for discharge at this time. Return precautions available on AVS and reviewed with patient.   Final  Clinical Impression(s) / ED Diagnoses Final diagnoses:  SOB (shortness of breath)  Diarrhea, unspecified type  Sinus bradycardia  Close exposure to COVID-19 virus    Rx / DC Orders ED Discharge Orders    None     Dr. Jose Persia Internal Medicine PGY-1  04/30/2019, 10:34 PM    Jose Persia, MD 04/30/19 2234    Margette Fast, MD 05/01/19 1950

## 2019-04-30 NOTE — ED Triage Notes (Signed)
Sob x 2 days. Fatigue, no appetite. She was exposed to Covid at work yesterday.

## 2019-04-30 NOTE — Discharge Instructions (Addendum)
[name],   You were seen in the ER for new shortness of breathe, chest pain, and diarrhea. While here, you had a chest Xray and blood work, which was all reassuring. The COVID-19 test has been sent and results are still pending. If your result is positive, someone from [facility] will call you. If negative, you will not receive a phone call. Please look on MyChart for your results.   If you should develop:   - worsening shortness of breathe - chest pain  Please return to the ED

## 2019-05-02 LAB — NOVEL CORONAVIRUS, NAA (HOSP ORDER, SEND-OUT TO REF LAB; TAT 18-24 HRS): SARS-CoV-2, NAA: NOT DETECTED

## 2020-09-12 IMAGING — DX DG CHEST 1V PORT
1 series · 1 of 1 positions shown · non-contrast
Comparison: 07/09/2016

CLINICAL DATA: Shortness of breath, chest pain

EXAM:
PORTABLE CHEST 1 VIEW

[chest ap]
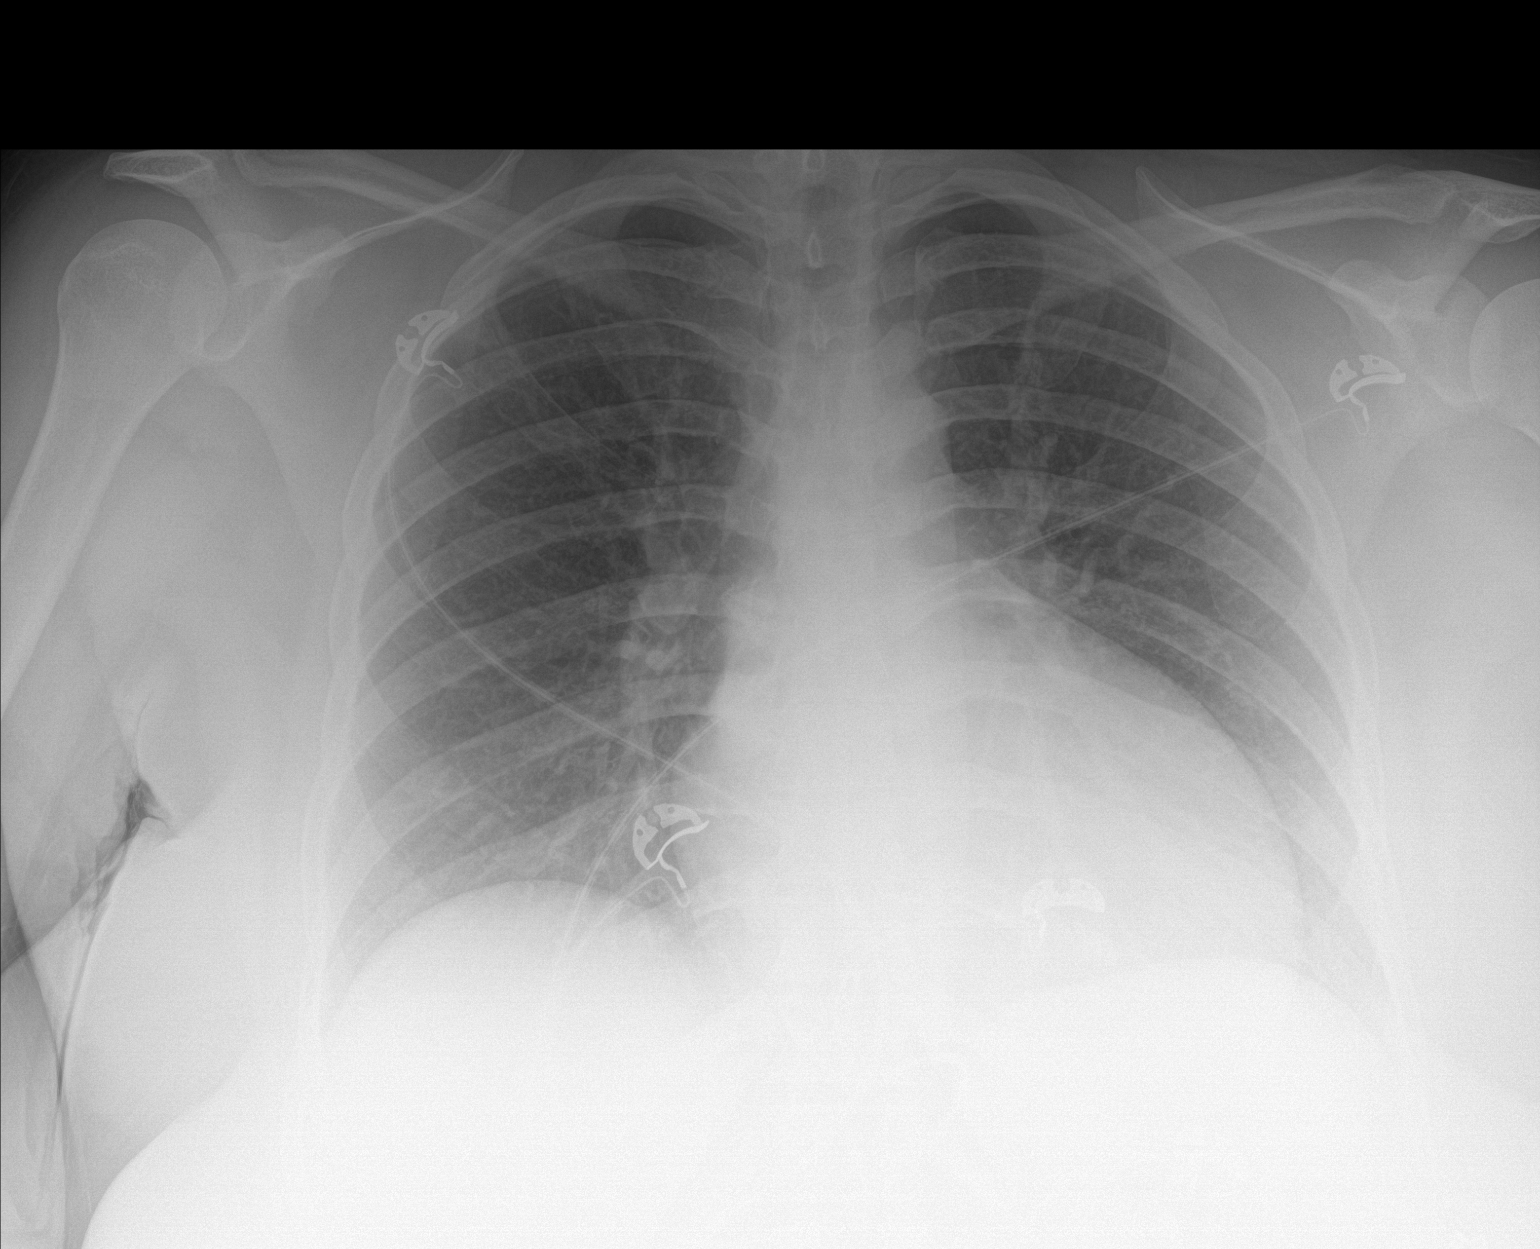

[1 of 1 positions shown; findings below may reference images not displayed]

FINDINGS: Heart is borderline in size. Lungs are clear. No effusions or edema.
No acute bony abnormality.
IMPRESSION: No active disease.

## 2021-10-08 ENCOUNTER — Encounter (HOSPITAL_BASED_OUTPATIENT_CLINIC_OR_DEPARTMENT_OTHER): Payer: Self-pay

## 2021-10-08 ENCOUNTER — Other Ambulatory Visit: Payer: Self-pay

## 2021-10-08 ENCOUNTER — Emergency Department (HOSPITAL_BASED_OUTPATIENT_CLINIC_OR_DEPARTMENT_OTHER)
Admission: EM | Admit: 2021-10-08 | Discharge: 2021-10-08 | Disposition: A | Discharge status: Home / Self Care | Payer: Medicaid Other | Attending: Emergency Medicine | Admitting: Emergency Medicine

## 2021-10-08 DIAGNOSIS — F17210 Nicotine dependence, cigarettes, uncomplicated: Secondary | ICD-10-CM | POA: Diagnosis not present

## 2021-10-08 DIAGNOSIS — J020 Streptococcal pharyngitis: Secondary | ICD-10-CM | POA: Diagnosis not present

## 2021-10-08 DIAGNOSIS — J029 Acute pharyngitis, unspecified: Secondary | ICD-10-CM | POA: Diagnosis present

## 2021-10-08 LAB — GROUP A STREP BY PCR: Group A Strep by PCR: DETECTED — AB

## 2021-10-08 MED ORDER — KETOROLAC TROMETHAMINE 30 MG/ML IJ SOLN: 30.0000 mg | Freq: Once | INTRAMUSCULAR | Status: DC

## 2021-10-08 MED ORDER — DEXAMETHASONE SODIUM PHOSPHATE 10 MG/ML IJ SOLN
10.0000 mg | Freq: Once | INTRAMUSCULAR | Status: AC
  Administered 2021-10-08: 10 mg via INTRAMUSCULAR
  Filled 2021-10-08: qty 1

## 2021-10-08 MED ORDER — PENICILLIN V POTASSIUM 500 MG PO TABS: 500.0000 mg | ORAL_TABLET | Freq: Two times a day (BID) | ORAL | 0 refills | Status: AC

## 2021-10-08 NOTE — ED Notes (Signed)
Pt states she does not want pain meds at this time due to it being IM.

## 2021-10-08 NOTE — ED Provider Notes (Signed)
[facility] EMERGENCY DEPARTMENT Provider Note   CSN: [id number] Arrival date & time: 10/08/21  4742     History  Chief Complaint  Patient presents with   Sore Throat   Otalgia   Pleurisy    [name] is a 36 y.o. female who presents to the ED for evaluation of sore throat and otalgia that started approximately 2 days ago.  Patient states that she woke up 2 days ago with pain on the left side of her throat.  It has progressively worsened to now include the right side and she is also having bilateral otalgia.  She has tenderness to palpation on her anterior neck near her tonsils.  She states that her voice is slightly muffled and endorses a headache.  She also mentions that she is having some tightness in chest when she takes a deep breath.  When asked further about this, she states that she has had this for several years and has had it worked up and always normal.  She states this symptom is unchanged from her baseline.  She denies cough, congestion, fevers, abdominal pain, nausea, vomiting and diarrhea.   Sore Throat Associated symptoms include headaches. Pertinent negatives include no chest pain, no abdominal pain and no shortness of breath.  Otalgia Associated symptoms: headaches and sore throat   Associated symptoms: no abdominal pain, no congestion and no vomiting        Home Medications Prior to Admission medications   Medication Sig Start Date End Date Taking? Authorizing Provider  penicillin v potassium (VEETID) 500 MG tablet Take 1 tablet (500 mg total) by mouth in the morning and at bedtime for 10 days. 10/08/21 10/18/21 Yes Janell Quiet, PA-C  hydrochlorothiazide (HYDRODIURIL) 25 MG tablet hydrochlorothiazide 25 mg tablet  Take 1 tablet every day by oral route. 07/09/16   [provider]  Ondansetron HCl (ZOFRAN PO) Take by mouth.    [provider]  Pediatric Multi Vit-Extra C-FA (CHILDRENS MULTIVITAMINS PO) Take by mouth.     [provider]      Allergies    Patient has no known allergies.    Review of Systems   Review of Systems  HENT:  Positive for ear pain and sore throat. Negative for congestion.   Respiratory:  Positive for chest tightness. Negative for shortness of breath.   Cardiovascular:  Negative for chest pain.  Gastrointestinal:  Negative for abdominal pain, nausea and vomiting.  Neurological:  Positive for headaches.    Physical Exam Updated Vital Signs BP (!) 142/65   Pulse (!) 56   Temp 98 F (36.7 C) (Oral)   Resp 16   Ht 5\' 7"  (1.702 m)   Wt 116.1 kg   LMP 09/06/2021   SpO2 100%   BMI 40.10 kg/m  Physical Exam Vitals and nursing note reviewed.  Constitutional:      General: She is not in acute distress.    Appearance: She is not ill-appearing.     Comments: Mild hot potato voice  HENT:     Head: Atraumatic.     Ears:     Comments: Fluid behind TMs bilaterally    Mouth/Throat:     Pharynx: Uvula midline. Posterior oropharyngeal erythema present. No uvula swelling.     Tonsils: Tonsillar exudate present. No tonsillar abscesses. 2+ on the right. 2+ on the left.  Eyes:     Conjunctiva/sclera: Conjunctivae normal.  Neck:     Comments: Tenderness to anterior cervical lymph nodes Cardiovascular:  Rate and Rhythm: Normal rate and regular rhythm.     Pulses: Normal pulses.     Heart sounds: No murmur heard. Pulmonary:     Effort: Pulmonary effort is normal. No respiratory distress.     Breath sounds: Normal breath sounds.  Abdominal:     General: Abdomen is flat. There is no distension.     Palpations: Abdomen is soft.     Tenderness: There is no abdominal tenderness.  Musculoskeletal:        General: Normal range of motion.     Cervical back: Normal range of motion.  Skin:    General: Skin is warm and dry.     Capillary Refill: Capillary refill takes less than 2 seconds.  Neurological:     General: No focal deficit present.     Mental Status: She is  alert.  Psychiatric:        Mood and Affect: Mood normal.     ED Results / Procedures / Treatments   Labs (all labs ordered are listed, but only abnormal results are displayed) Labs Reviewed  GROUP A STREP BY PCR - Abnormal; Notable for the following components:      Result Value   Group A Strep by PCR DETECTED (*)    All other components within normal limits    EKG EKG Interpretation  Date/Time:  Friday October 08 2021 09:22:43 EDT Ventricular Rate:  55 PR Interval:  136 QRS Duration: 98 QT Interval:  453 QTC Calculation: 434 R Axis:   53 Text Interpretation: Sinus rhythm No significant change since prior 2/21 Confirmed by Aletta Edouard [phone]) on 10/08/2021 9:25:04 AM  Radiology No results found.  Procedures Procedures    Medications Ordered in ED Medications  ketorolac (TORADOL) 30 MG/ML injection 30 mg (has no administration in time range)  dexamethasone (DECADRON) injection 10 mg (10 mg Intramuscular Given 10/08/21 0946)    ED Course/ Medical Decision Making/ A&P                           Medical Decision Making Risk Prescription drug management.   Social determinants of health:  Social History   Socioeconomic History   Marital status: Single    Spouse name: Not on file   Number of children: Not on file   Years of education: Not on file   Highest education level: Not on file  Occupational History   Not on file  Tobacco Use   Smoking status: Some Days    Types: Cigarettes   Smokeless tobacco: Never  Substance and Sexual Activity   Alcohol use: No   Drug use: No   Sexual activity: Yes    Birth control/protection: None  Other Topics Concern   Not on file  Social History Narrative   Not on file   Social Determinants of Health   Financial Resource Strain: Not on file  Food Insecurity: Not on file  Transportation Needs: Not on file  Physical Activity: Not on file  Stress: Not on file  Social Connections: Not on file  Intimate Partner  Violence: Not on file     Initial impression:  This patient presents to the ED for concern of sore throat, this involves an extensive number of treatment options, and is a complaint that carries with it a high risk of complications and morbidity.   Differentials include strep, tonsillitis, tonsillar abscess, viral URI.   Comorbidities affecting care:  none  Additional history obtained:  none  Lab Tests  I Ordered, reviewed, and interpreted labs and EKG.  The pertinent results include:  Strep test positive  EKG: NSR  Cardiac Monitoring:  The patient was maintained on a cardiac monitor.  I personally viewed and interpreted the cardiac monitored which showed an underlying rhythm of: Sinus rhythm   Medicines ordered and prescription drug management:  I ordered medication including: Decadron 10 mg Reevaluation of the patient after these medicines showed that the patient stayed the same I have reviewed the patients home medicines and have made adjustments as needed   ED Course/Re-evaluation: Presents in no acute distress and is nontoxic-appearing.Vitals without significant abnormality.  She is satting well on room air.  Voice appears slightly muffled.  Bilateral tonsillar swelling with exudate.  Uvula is midline.  Bilateral tympanic membranes are bulging and erythematous.  Given the lack of cough and congestion, did not order COVID or flu test.  Additionally, given that patient has some chest tightness chronically and patient has Well's score of 0, do not think emergent workup is necessary.  EKG normal.  Patient was given Decadron for her tonsillar swelling.  Strep test was positive.  Will discharge home with penicillin twice daily x10 days.  Patient to follow-up with PCP as needed.  She expresses understanding and is amenable to plan.  Disposition:  After consideration of the diagnostic results, physical exam, history and the patients response to treatment feel that the patent would  benefit from discharge.   Strep pharyngitis: Plan and management as described above. Discharged home in good condition.  Final Clinical Impression(s) / ED Diagnoses Final diagnoses:  Strep pharyngitis    Rx / DC Orders ED Discharge Orders          Ordered    penicillin v potassium (VEETID) 500 MG tablet  2 times daily        10/08/21 1000              Janell Quiet, [place] 10/08/21 1007    Terrilee Files, MD 10/08/21 1733

## 2021-10-08 NOTE — Discharge Instructions (Addendum)
You were diagnosed with strep throat today.  I have sent in an antibiotic for you to pick up at your pharmacy.  Please take the entire course even if you are feeling better to prevent reoccurrence and antibiotic resistance.  You can take throat lozenges to help with some of your discomfort although the steroids antibiotic should start making you feel better hopefully in the next day or so.  Follow-up with your PCP as needed I hope you feel better soon.

## 2021-10-08 NOTE — ED Triage Notes (Signed)
Patient states she has had a sore throat x Wednesday - states she has progressively gotten worse. States today she is now having bilateral pain. Patient denies congestion. States she woke up this morning with chest tightness states taking deep breaths makes it worse.
